# Patient Record
Sex: Male | Born: 1975 | Race: Black or African American | Hispanic: No | Marital: Married | Smoking: Never smoker
Health system: Southern US, Community
[De-identification: ages and names within clinical notes are randomized; demographics above are authoritative.]

---

## 2009-06-06 ENCOUNTER — Emergency Department (HOSPITAL_COMMUNITY): Admission: EM | Admit: 2009-06-06 | Discharge: 2009-06-06 | Payer: Self-pay | Admitting: Emergency Medicine

## 2009-06-12 ENCOUNTER — Emergency Department (HOSPITAL_COMMUNITY): Admission: EM | Admit: 2009-06-12 | Discharge: 2009-06-12 | Payer: Self-pay | Admitting: Emergency Medicine

## 2009-06-26 ENCOUNTER — Encounter: Admission: RE | Admit: 2009-06-26 | Discharge: 2009-09-19 | Payer: Self-pay | Admitting: Orthopedic Surgery

## 2010-06-20 IMAGING — CR DG WRIST COMPLETE 3+V*L*
4 series · 4 of 4 positions shown · non-contrast
Comparison: None

CLINICAL DATA: Trauma

LEFT WRIST - COMPLETE 3+ VIEW

[x wrist pa left]
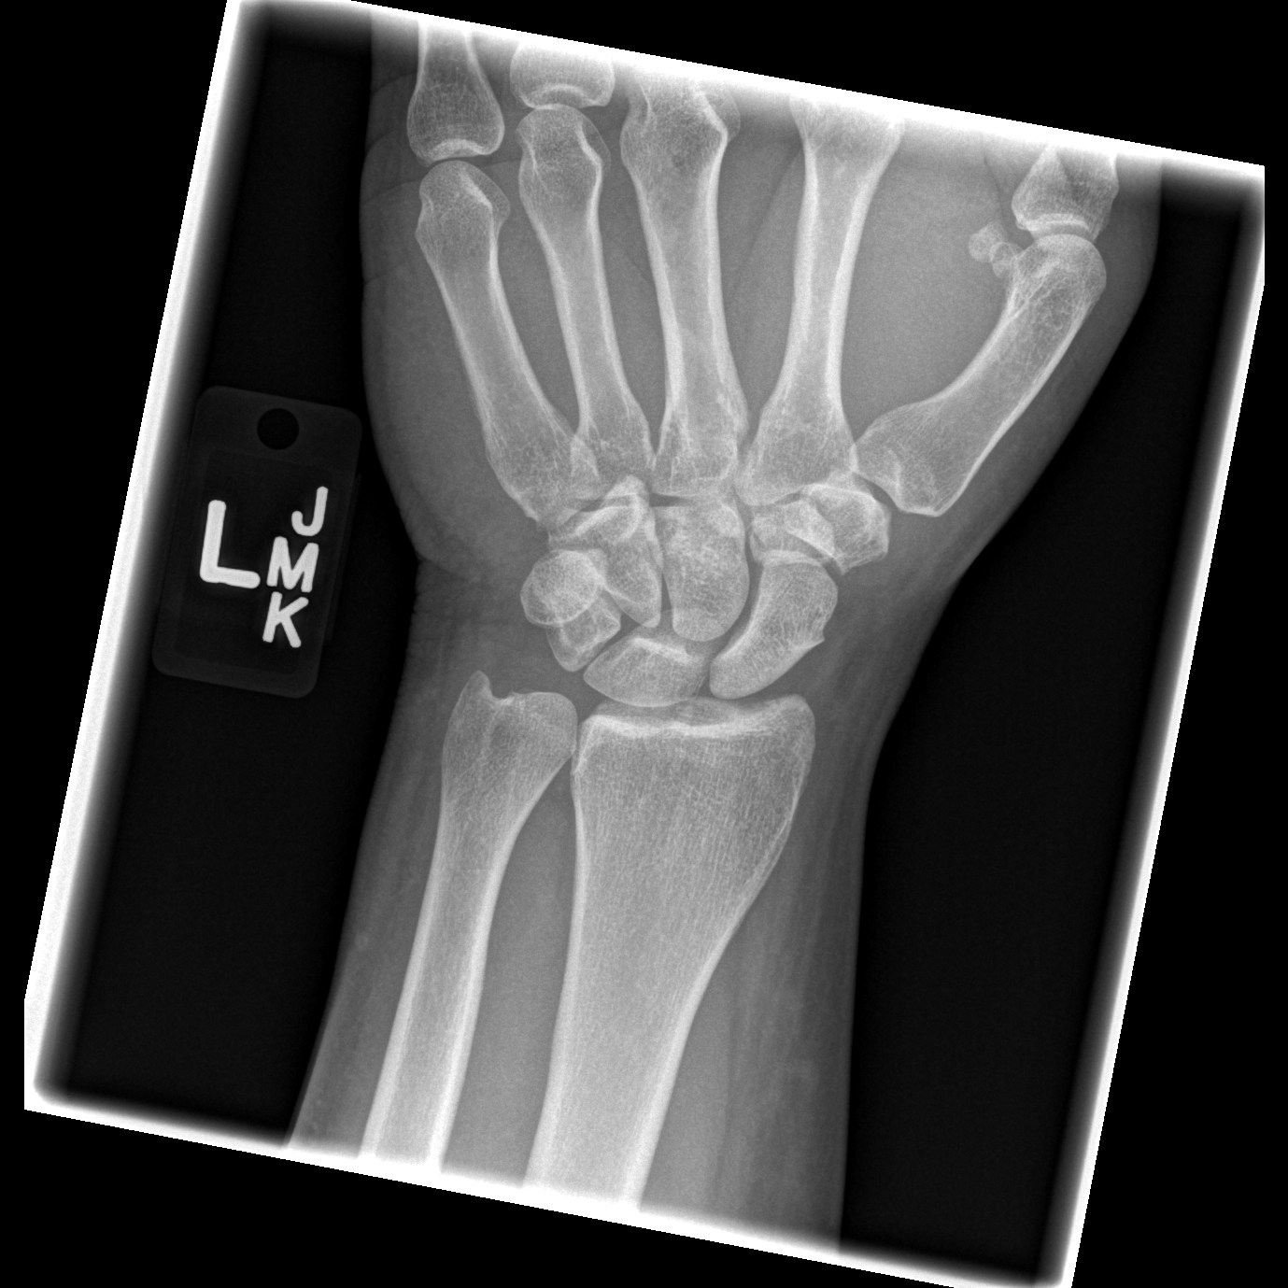

[x wrist obl left]
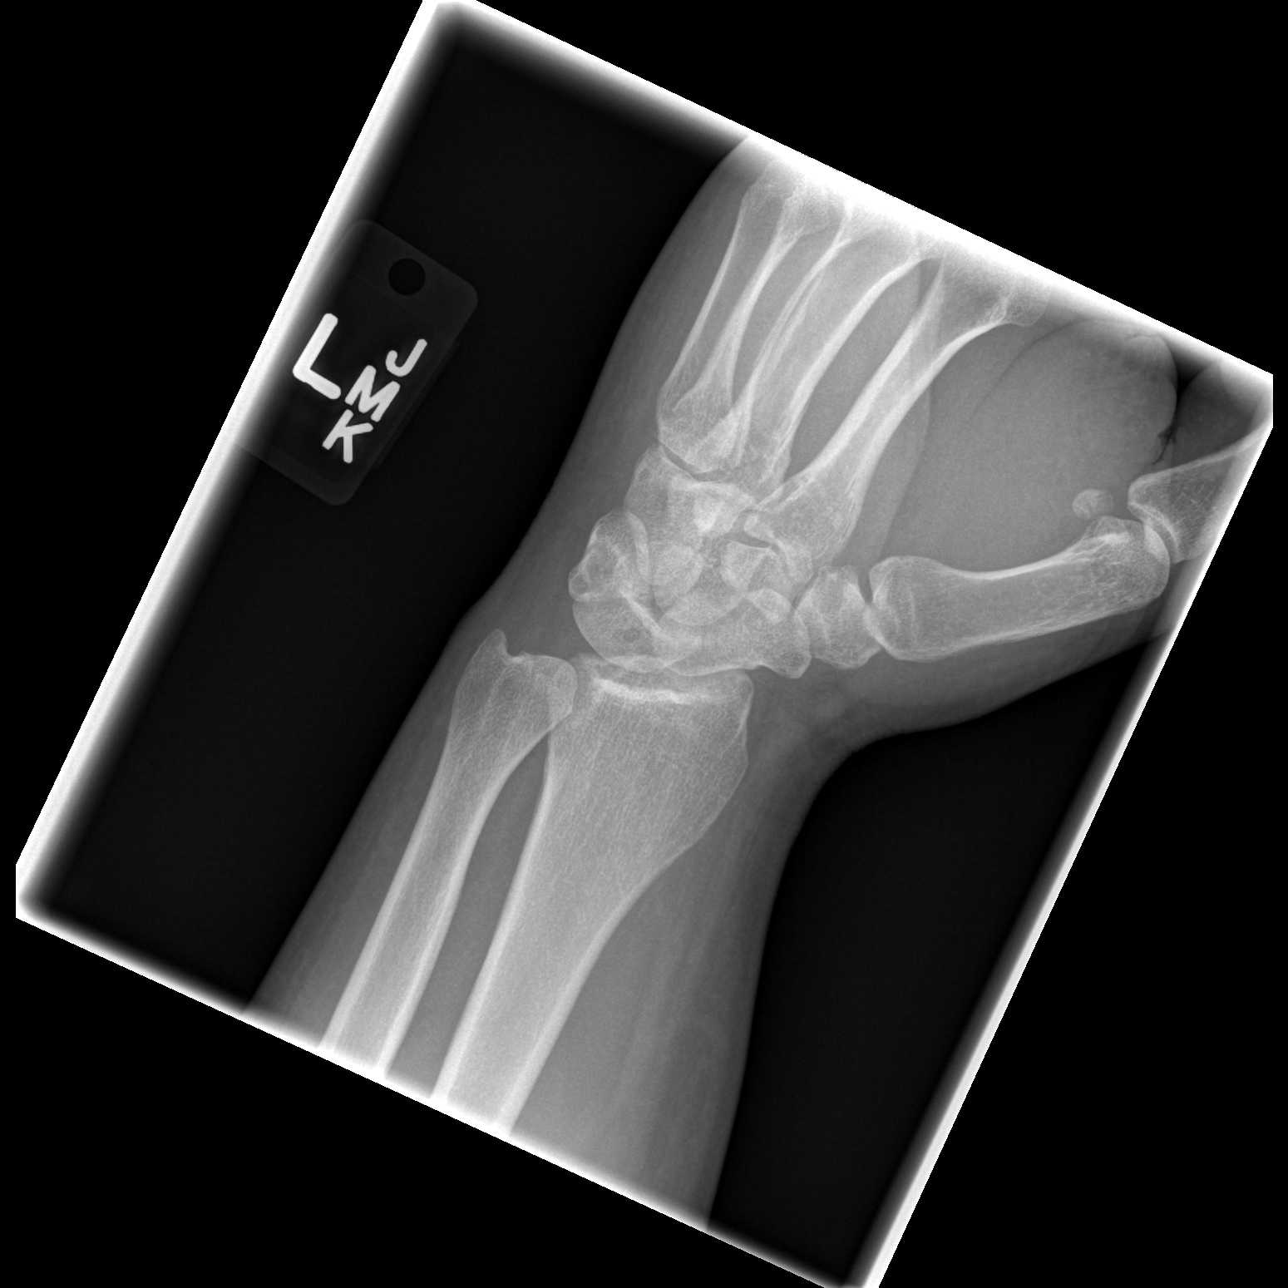

[x wrist lat left]
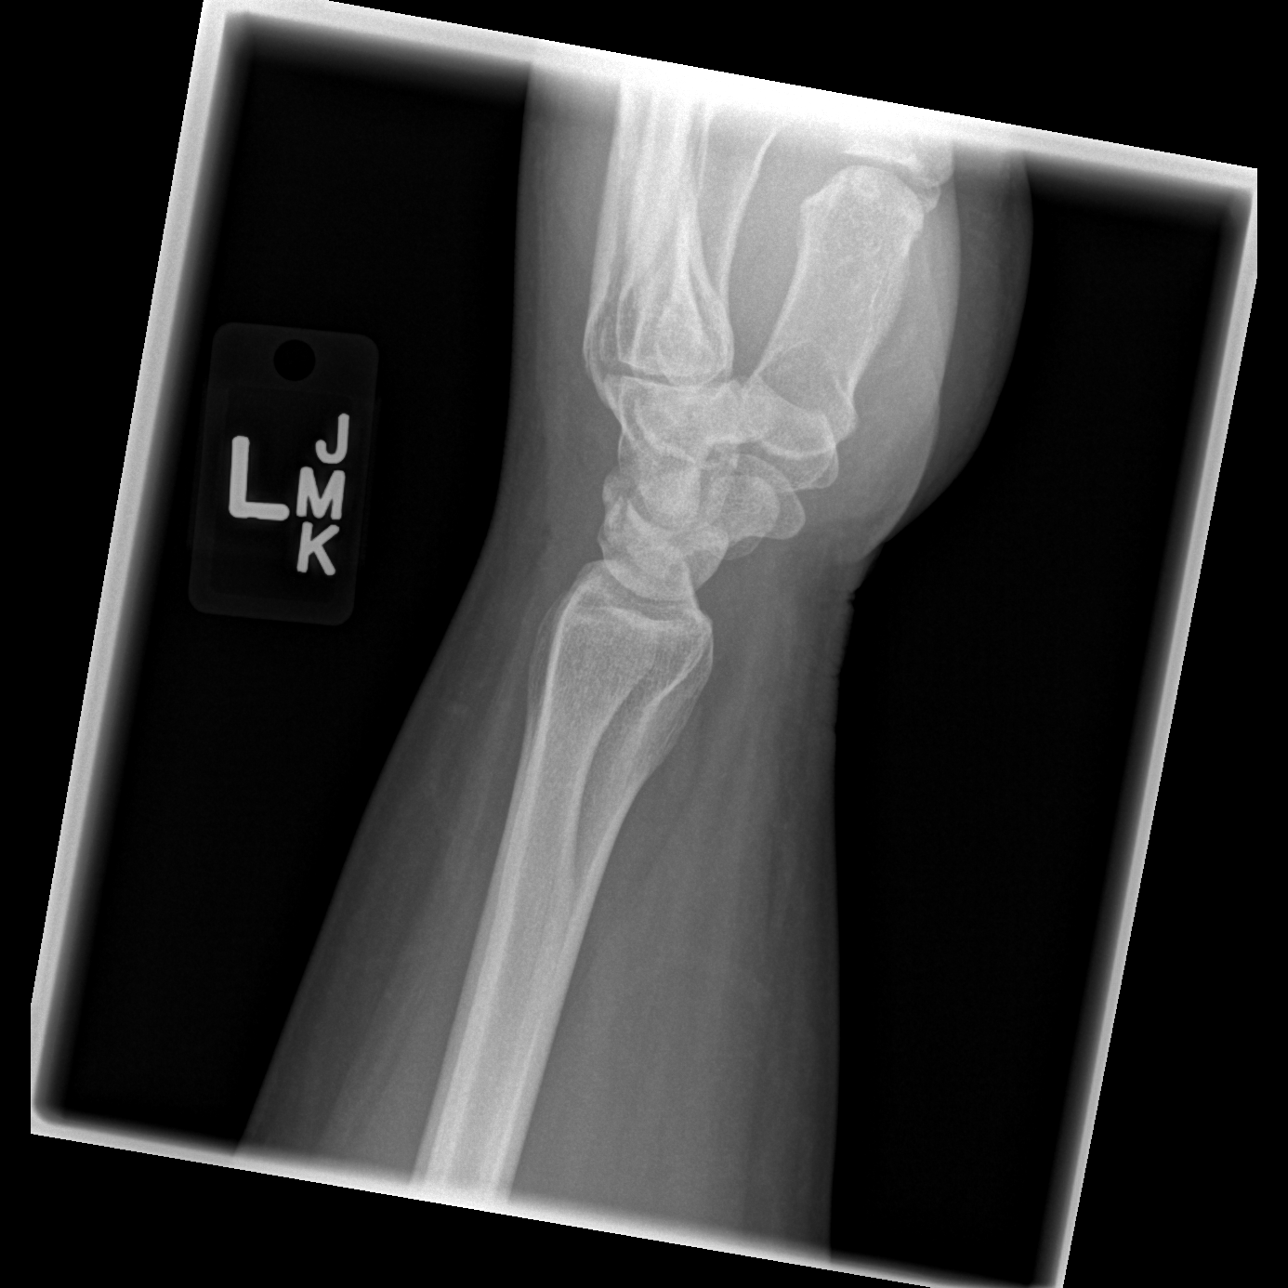

[x navicular]
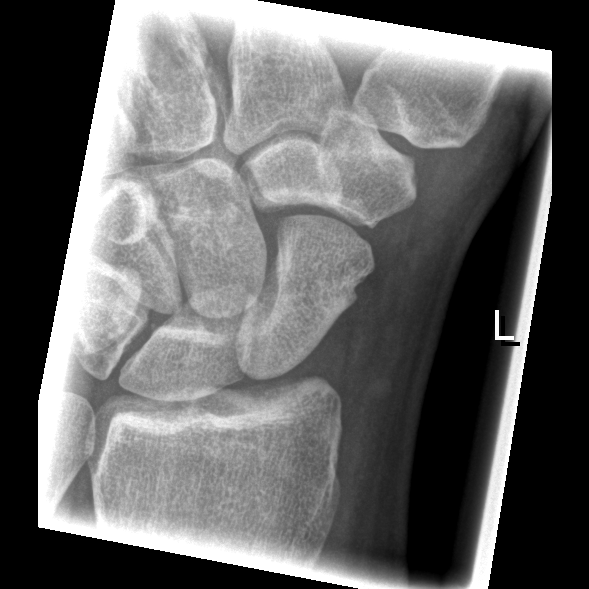

[4 of 4 positions shown; findings below may reference images not displayed]

FINDINGS: Alignment of the wrist is anatomic.  The intercarpal
articulations are normal.  Radiocarpal articulation is intact with
mild degenerative changes. A fracture along the proximal third
metacarpal is identified
IMPRESSION: No wrist fracture.

## 2010-06-20 IMAGING — CR DG HAND COMPLETE 3+V*L*
3 series · 3 of 3 positions shown · non-contrast
Comparison: None

CLINICAL DATA: Trauma

LEFT HAND - COMPLETE 3+ VIEW

[x hand pa left]
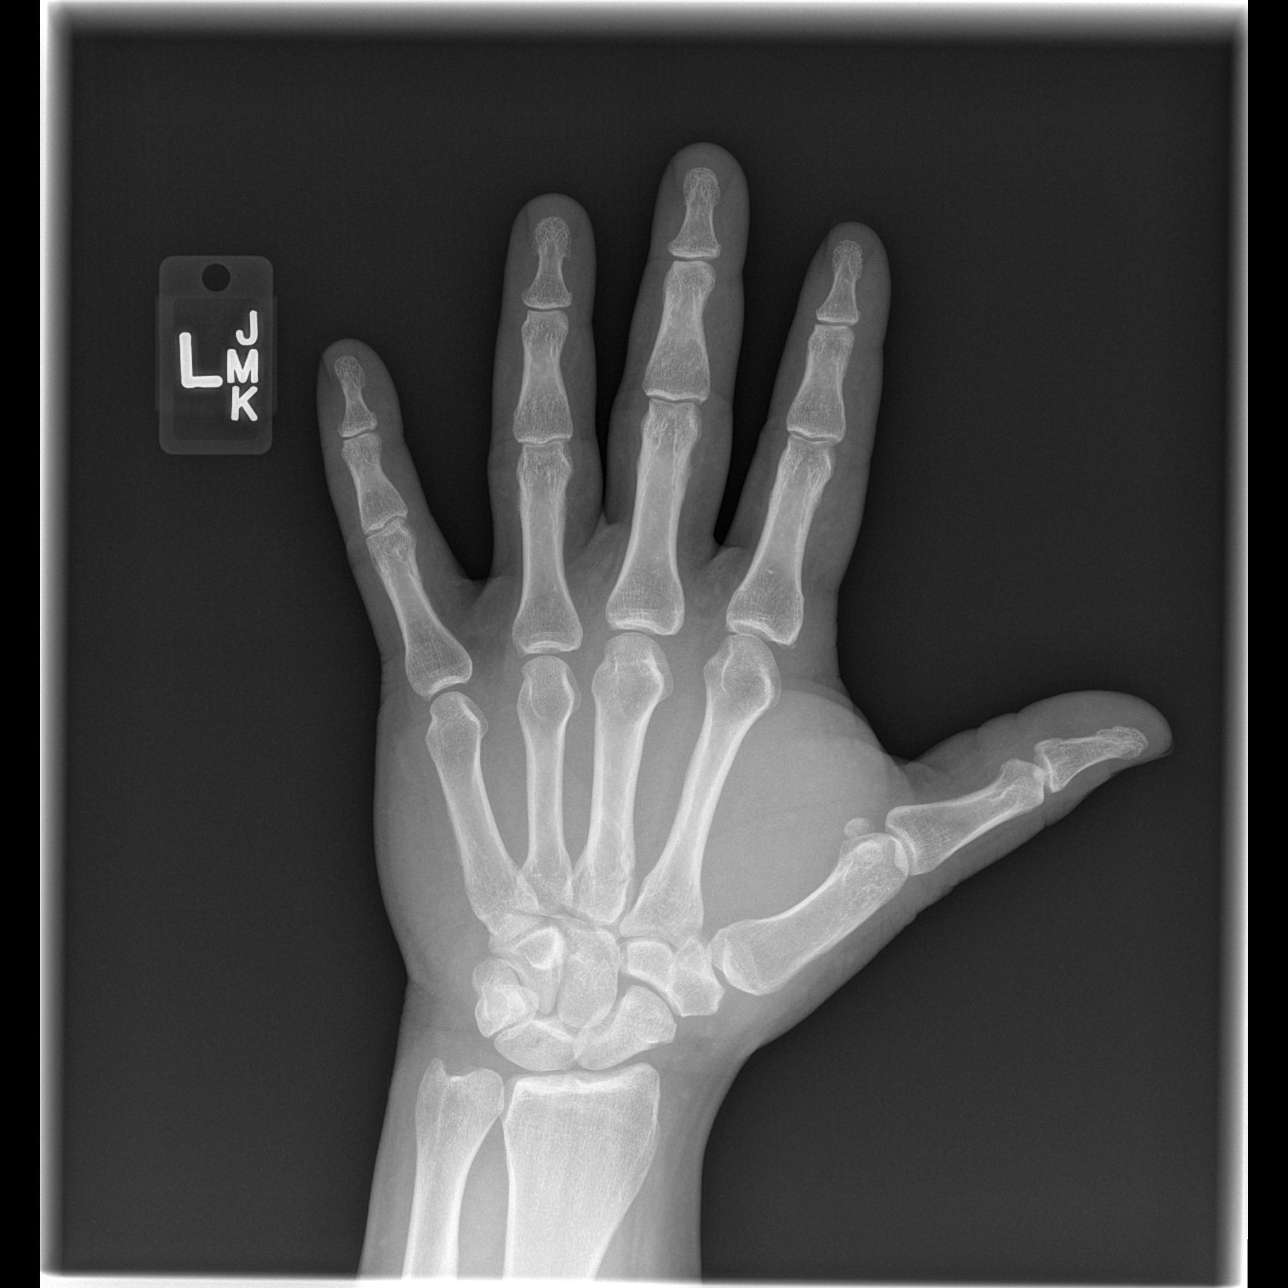

[x hand oblique left]
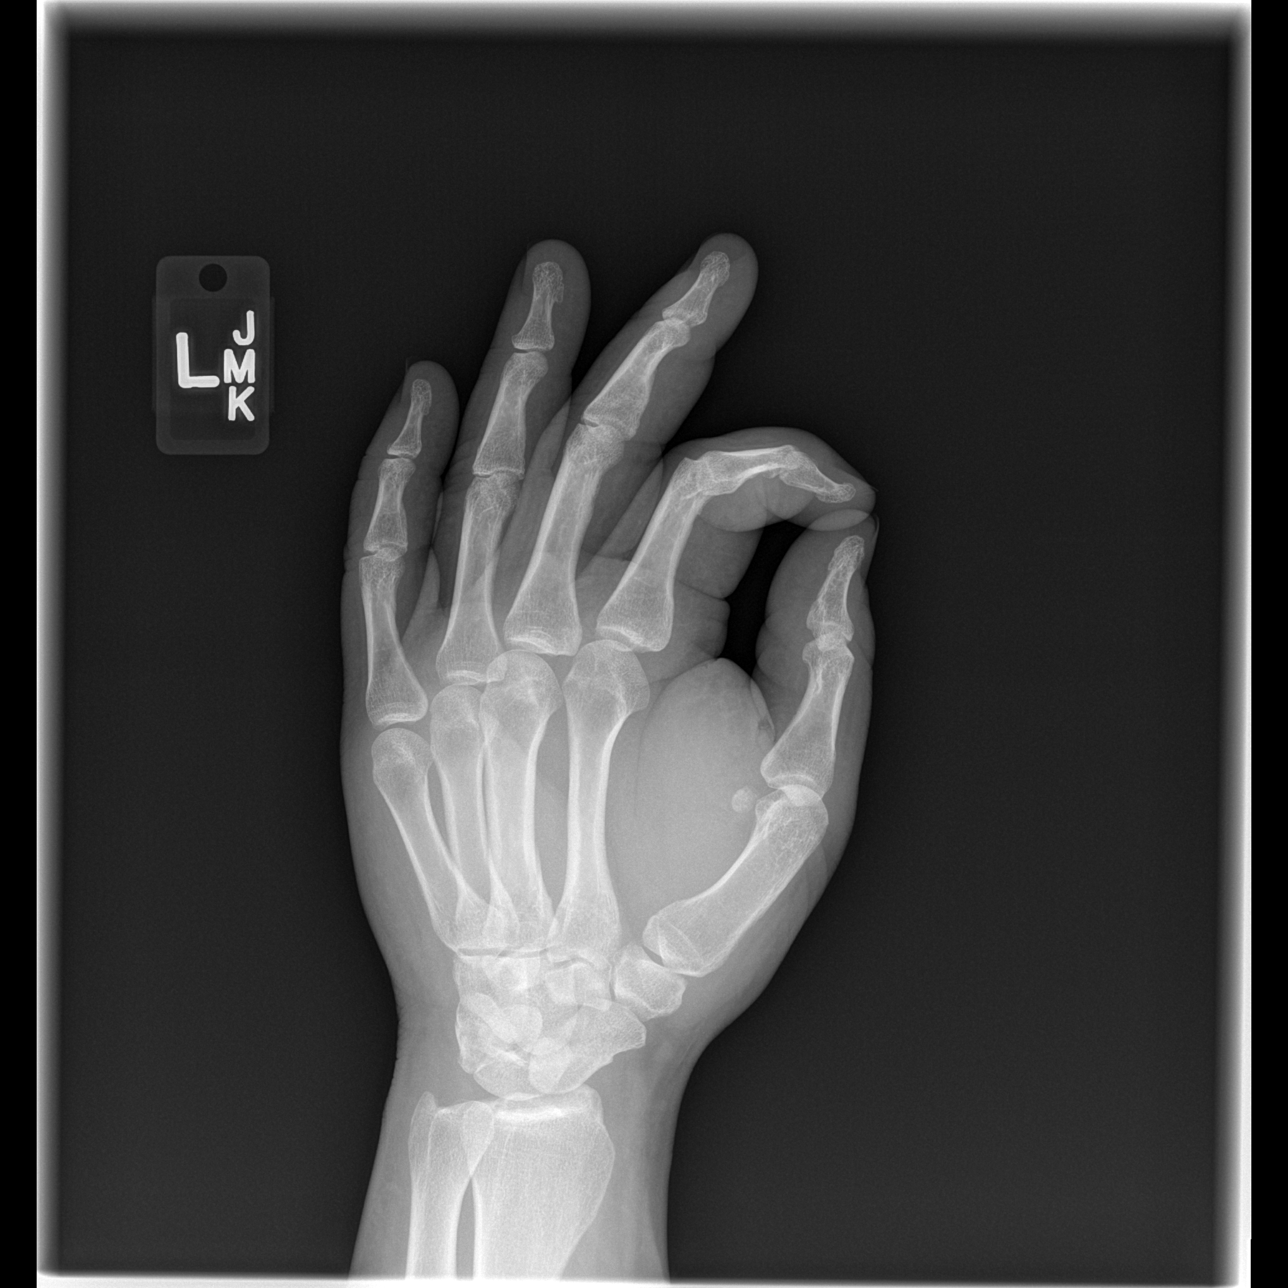

[x hand lat left]
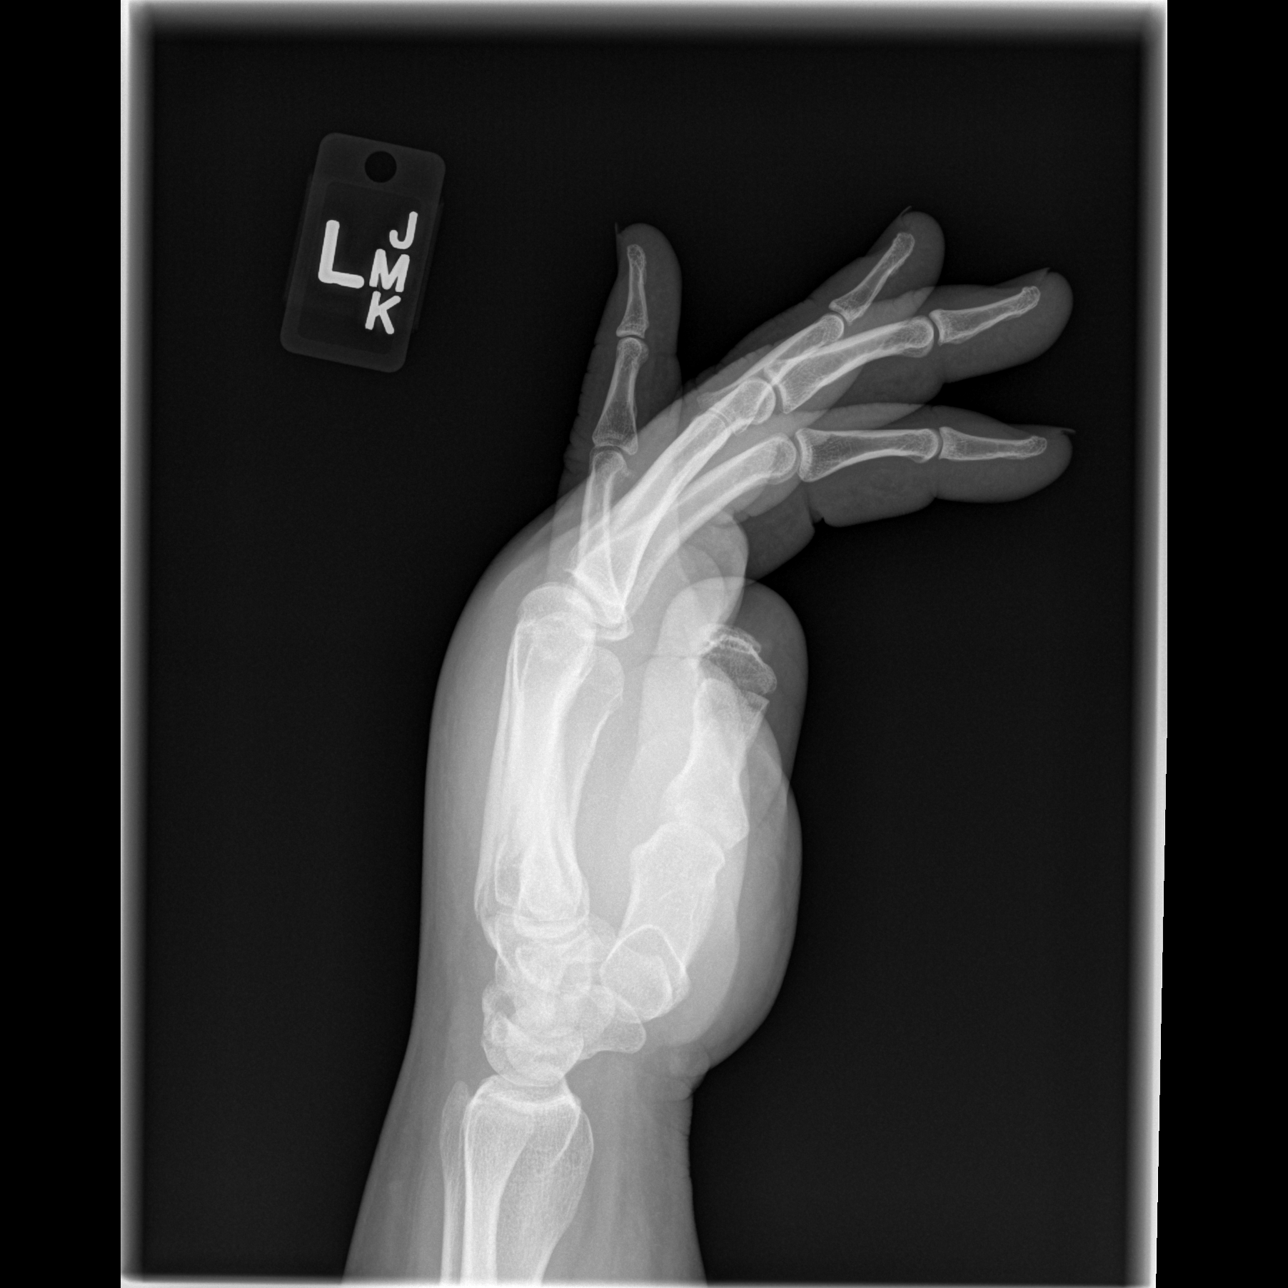

[3 of 3 positions shown; findings below may reference images not displayed]

FINDINGS: Extensive dorsal soft tissue swelling.  Nondisplaced
fracture base of 3rd metacarpal.  Alignment remains anatomic.
IMPRESSION: Nondisplaced fracture proximal third metacarpal.

## 2014-07-21 ENCOUNTER — Emergency Department (HOSPITAL_COMMUNITY)
Admission: EM | Admit: 2014-07-21 | Discharge: 2014-07-21 | Disposition: A | Discharge status: Home / Self Care | Payer: Self-pay | Attending: Emergency Medicine | Admitting: Emergency Medicine

## 2014-07-21 ENCOUNTER — Encounter (HOSPITAL_COMMUNITY): Payer: Self-pay | Admitting: Emergency Medicine

## 2014-07-21 DIAGNOSIS — K047 Periapical abscess without sinus: Secondary | ICD-10-CM | POA: Insufficient documentation

## 2014-07-21 DIAGNOSIS — K089 Disorder of teeth and supporting structures, unspecified: Secondary | ICD-10-CM | POA: Insufficient documentation

## 2014-07-21 DIAGNOSIS — Z791 Long term (current) use of non-steroidal anti-inflammatories (NSAID): Secondary | ICD-10-CM | POA: Insufficient documentation

## 2014-07-21 DIAGNOSIS — K029 Dental caries, unspecified: Secondary | ICD-10-CM | POA: Insufficient documentation

## 2014-07-21 MED ORDER — PENICILLIN V POTASSIUM 500 MG PO TABS
500.0000 mg | ORAL_TABLET | Freq: Once | ORAL | Status: AC
  Administered 2014-07-21: 500 mg via ORAL
  Filled 2014-07-21: qty 1

## 2014-07-21 MED ORDER — TRAMADOL HCL 50 MG PO TABS
50.0000 mg | ORAL_TABLET | Freq: Once | ORAL | Status: AC
  Administered 2014-07-21: 50 mg via ORAL
  Filled 2014-07-21: qty 1

## 2014-07-21 MED ORDER — TRAMADOL HCL 50 MG PO TABS: 50.0000 mg | ORAL_TABLET | Freq: Four times a day (QID) | ORAL | Status: AC | PRN

## 2014-07-21 MED ORDER — PENICILLIN V POTASSIUM 500 MG PO TABS: 500.0000 mg | ORAL_TABLET | Freq: Four times a day (QID) | ORAL | Status: AC

## 2014-07-21 NOTE — Discharge Instructions (Signed)
Dental Abscess A dental abscess is a collection of infected fluid (pus) from a bacterial infection in the inner part of the tooth (pulp). It usually occurs at the end of the tooth's root.  CAUSES   Severe tooth decay.  Trauma to the tooth that allows bacteria to enter into the pulp, such as a broken or chipped tooth. SYMPTOMS   Severe pain in and around the infected tooth.  Swelling and redness around the abscessed tooth or in the mouth or face.  Tenderness.  Pus drainage.  Bad breath.  Bitter taste in the mouth.  Difficulty swallowing.  Difficulty opening the mouth.  Nausea.  Vomiting.  Chills.  Swollen neck glands. DIAGNOSIS   A medical and dental history will be taken.  An examination will be performed by tapping on the abscessed tooth.  X-rays may be taken of the tooth to identify the abscess. TREATMENT The goal of treatment is to eliminate the infection. You may be prescribed antibiotic medicine to stop the infection from spreading. A root canal may be performed to save the tooth. If the tooth cannot be saved, it may be pulled (extracted) and the abscess may be drained.  HOME CARE INSTRUCTIONS  Only take over-the-counter or prescription medicines for pain, fever, or discomfort as directed by your caregiver.  Rinse your mouth (gargle) often with salt water ( tsp salt in 8 oz [250 ml] of warm water) to relieve pain or swelling.  Do not drive after taking pain medicine (narcotics).  Do not apply heat to the outside of your face.  Return to your dentist for further treatment as directed. SEEK MEDICAL CARE IF:  Your pain is not helped by medicine.  Your pain is getting worse instead of better. SEEK IMMEDIATE MEDICAL CARE IF:  You have a fever or persistent symptoms for more than 2-3 days.  You have a fever and your symptoms suddenly get worse.  You have chills or a very bad headache.  You have problems breathing or swallowing.  You have trouble  opening your mouth.  You have swelling in the neck or around the eye. Document Released: 12/07/2005 Document Revised: 08/31/2012 Document Reviewed: 03/17/2011 [facility] Patient Information 2015 [place], [place]. This information is not intended to replace advice given to you by your health care provider. Make sure you discuss any questions you have with your health care provider. Please try to find a dentist in [place] for definitive care

## 2014-07-21 NOTE — ED Notes (Signed)
Pt complains of a toothache for one week

## 2014-07-21 NOTE — ED Provider Notes (Signed)
CSN: [id number]     Arrival date & time 07/21/14  0130 History   First MD Initiated Contact with Patient 07/21/14 0146     Chief Complaint  Patient presents with  . Dental Pain     (Consider location/radiation/quality/duration/timing/severity/associated sxs/prior Treatment) HPI Comments: Long standing cavioty LL 3rd molar now with gum swelling and increased pain   Patient is a 38 y.o. male presenting with tooth pain. The history is provided by the patient.  Dental Pain Location:  Lower Lower teeth location:  17/LL 3rd molar Quality:  Pulsating Severity:  Moderate Onset quality:  Gradual Timing:  Constant Progression:  Worsening Chronicity:  Recurrent Context: dental caries   Relieved by:  Nothing Worsened by:  Cold food/drink Ineffective treatments:  NSAIDs Associated symptoms: gum swelling   Associated symptoms: no difficulty swallowing, no drooling, no facial pain, no facial swelling, no fever, no headaches, no neck swelling, no oral bleeding, no oral lesions and no trismus     History reviewed. No pertinent past medical history. History reviewed. No pertinent past surgical history. History reviewed. No pertinent family history. History  Substance Use Topics  . Smoking status: Never Smoker   . Smokeless tobacco: Not on file  . Alcohol Use: No    Review of Systems  Constitutional: Negative for fever and chills.  HENT: Positive for dental problem. Negative for drooling, facial swelling, mouth sores and trouble swallowing.   Neurological: Negative for headaches.  All other systems reviewed and are negative.     Allergies  Review of patient's allergies indicates no known allergies.  Home Medications   Prior to Admission medications   Medication Sig Start Date End Date Taking? Authorizing Provider  naproxen sodium (ANAPROX) 220 MG tablet Take 220 mg by mouth 2 (two) times daily with a meal.   Yes Historical Provider, MD   BP 158/107  Pulse 71  Temp(Src) 98.6 F  (37 C) (Oral)  Resp 14  SpO2 100% Physical Exam  Nursing note and vitals reviewed. Constitutional: He is oriented to person, place, and time. He appears well-developed and well-nourished.  HENT:  Head: Normocephalic.  Right Ear: External ear normal.  Left Ear: External ear normal.  Mouth/Throat: Uvula is midline.    Eyes: Pupils are equal, round, and reactive to light.  Cardiovascular: Normal rate and regular rhythm.   Pulmonary/Chest: Effort normal and breath sounds normal.  Lymphadenopathy:    He has no cervical adenopathy.  Neurological: He is alert and oriented to person, place, and time.  Skin: Skin is warm and dry.    ED Course  Procedures (including critical care time) Labs Review Labs Reviewed - No data to display  Imaging Review No results found.   EKG Interpretation None      MDM  Patient is moving to [place] in 2 days for job  Will be there 6 + months  Encouraged to find dentist at that location  Final diagnoses:  Dental caries  Dental abscess         Arman FilterGail K Kajuan Guyton, NP 07/21/14 0207

## 2014-07-23 NOTE — ED Provider Notes (Signed)
Medical screening examination/treatment/procedure(s) were performed by non-physician practitioner and as supervising physician I was immediately available for consultation/collaboration.   EKG Interpretation None       Season Astacio, MD 07/23/14 1106
